# Patient Record
Sex: Male | Born: 1965 | Race: White | Hispanic: No | Marital: Single | Smoking: Current every day smoker
Health system: Southern US, Community
[De-identification: ages and names within clinical notes are randomized; demographics above are authoritative.]

## PROBLEM LIST (undated history)

## (undated) HISTORY — PX: HERNIA REPAIR: SHX51

---

## 1997-11-10 ENCOUNTER — Emergency Department (HOSPITAL_COMMUNITY): Admission: EM | Admit: 1997-11-10 | Discharge: 1997-11-10 | Payer: Self-pay | Admitting: Emergency Medicine

## 1999-11-27 ENCOUNTER — Emergency Department (HOSPITAL_COMMUNITY): Admission: EM | Admit: 1999-11-27 | Discharge: 1999-11-27 | Payer: Self-pay | Admitting: Emergency Medicine

## 1999-11-27 ENCOUNTER — Encounter: Payer: Self-pay | Admitting: Emergency Medicine

## 2000-02-13 ENCOUNTER — Emergency Department (HOSPITAL_COMMUNITY): Admission: EM | Admit: 2000-02-13 | Discharge: 2000-02-13 | Payer: Self-pay | Admitting: Emergency Medicine

## 2001-11-09 ENCOUNTER — Emergency Department (HOSPITAL_COMMUNITY): Admission: EM | Admit: 2001-11-09 | Discharge: 2001-11-09 | Payer: Self-pay | Admitting: Emergency Medicine

## 2008-04-16 ENCOUNTER — Emergency Department (HOSPITAL_COMMUNITY): Admission: EM | Admit: 2008-04-16 | Discharge: 2008-04-17 | Payer: Self-pay | Admitting: Emergency Medicine

## 2008-04-19 ENCOUNTER — Emergency Department (HOSPITAL_COMMUNITY): Admission: EM | Admit: 2008-04-19 | Discharge: 2008-04-19 | Payer: Self-pay | Admitting: Emergency Medicine

## 2009-08-09 ENCOUNTER — Emergency Department (HOSPITAL_BASED_OUTPATIENT_CLINIC_OR_DEPARTMENT_OTHER): Admission: EM | Admit: 2009-08-09 | Discharge: 2009-08-09 | Payer: Self-pay | Admitting: Emergency Medicine

## 2009-08-19 ENCOUNTER — Emergency Department (HOSPITAL_BASED_OUTPATIENT_CLINIC_OR_DEPARTMENT_OTHER): Admission: EM | Admit: 2009-08-19 | Discharge: 2009-08-19 | Payer: Self-pay | Admitting: Emergency Medicine

## 2009-09-03 ENCOUNTER — Emergency Department (HOSPITAL_BASED_OUTPATIENT_CLINIC_OR_DEPARTMENT_OTHER): Admission: EM | Admit: 2009-09-03 | Discharge: 2009-09-03 | Payer: Self-pay | Admitting: Emergency Medicine

## 2009-09-25 ENCOUNTER — Encounter (INDEPENDENT_AMBULATORY_CARE_PROVIDER_SITE_OTHER): Payer: Self-pay | Admitting: Family Medicine

## 2009-09-25 ENCOUNTER — Ambulatory Visit: Payer: Self-pay | Admitting: Internal Medicine

## 2009-09-25 LAB — CONVERTED CEMR LAB
ALT: 19 units/L (ref 0–53)
AST: 17 units/L (ref 0–37)
Albumin: 4.9 g/dL (ref 3.5–5.2)
Alkaline Phosphatase: 33 units/L — ABNORMAL LOW (ref 39–117)
BUN: 16 mg/dL (ref 6–23)
Basophils Absolute: 0 10*3/uL (ref 0.0–0.1)
Basophils Relative: 0 % (ref 0–1)
CO2: 28 meq/L (ref 19–32)
Calcium: 10.2 mg/dL (ref 8.4–10.5)
Chloride: 100 meq/L (ref 96–112)
Cholesterol: 200 mg/dL (ref 0–200)
Creatinine, Ser: 1.09 mg/dL (ref 0.40–1.50)
Eosinophils Absolute: 0.2 10*3/uL (ref 0.0–0.7)
Eosinophils Relative: 2 % (ref 0–5)
Glucose, Bld: 92 mg/dL (ref 70–99)
HCT: 49.2 % (ref 39.0–52.0)
HDL: 44 mg/dL (ref >39)
Hemoglobin: 16.2 g/dL (ref 13.0–17.0)
LDL Cholesterol: 127 mg/dL — ABNORMAL HIGH (ref 0–99)
Lymphocytes Relative: 25 % (ref 12–46)
Lymphs Abs: 2.8 10*3/uL (ref 0.7–4.0)
MCHC: 32.9 g/dL (ref 30.0–36.0)
MCV: 93.7 fL (ref 78.0–100.0)
Monocytes Absolute: 0.7 10*3/uL (ref 0.1–1.0)
Monocytes Relative: 7 % (ref 3–12)
Neutro Abs: 7.3 10*3/uL (ref 1.7–7.7)
Neutrophils Relative %: 66 % (ref 43–77)
Platelets: 306 10*3/uL (ref 150–400)
Potassium: 4.8 meq/L (ref 3.5–5.3)
RBC: 5.25 M/uL (ref 4.22–5.81)
RDW: 13.7 % (ref 11.5–15.5)
Sodium: 139 meq/L (ref 135–145)
Total Bilirubin: 0.6 mg/dL (ref 0.3–1.2)
Total CHOL/HDL Ratio: 4.5
Total Protein: 7 g/dL (ref 6.0–8.3)
Triglycerides: 147 mg/dL (ref <150)
VLDL: 29 mg/dL (ref 0–40)
WBC: 11 10*3/uL — ABNORMAL HIGH (ref 4.0–10.5)

## 2009-10-14 ENCOUNTER — Ambulatory Visit (HOSPITAL_COMMUNITY): Admission: RE | Admit: 2009-10-14 | Discharge: 2009-10-14 | Payer: Self-pay | Admitting: Family Medicine

## 2009-10-23 ENCOUNTER — Encounter: Admission: RE | Admit: 2009-10-23 | Discharge: 2009-11-20 | Payer: Self-pay | Admitting: Family Medicine

## 2009-11-06 ENCOUNTER — Encounter (INDEPENDENT_AMBULATORY_CARE_PROVIDER_SITE_OTHER): Payer: Self-pay | Admitting: Family Medicine

## 2009-11-06 LAB — CONVERTED CEMR LAB
Amphetamine Screen, Ur: NEGATIVE
Barbiturate Quant, Ur: NEGATIVE
Benzodiazepines.: NEGATIVE
Cocaine Metabolites: NEGATIVE
Creatinine,U: 75.4 mg/dL
Marijuana Metabolite: NEGATIVE
Methadone: NEGATIVE
Opiate Screen, Urine: NEGATIVE
Phencyclidine (PCP): NEGATIVE
Propoxyphene: NEGATIVE

## 2009-12-17 ENCOUNTER — Ambulatory Visit (HOSPITAL_BASED_OUTPATIENT_CLINIC_OR_DEPARTMENT_OTHER): Admission: RE | Admit: 2009-12-17 | Discharge: 2009-12-17 | Payer: Self-pay | Admitting: General Surgery

## 2010-04-15 LAB — CBC
HCT: 41.9 % (ref 39.0–52.0)
Hemoglobin: 14.7 g/dL (ref 13.0–17.0)
MCH: 30.7 pg (ref 26.0–34.0)
MCHC: 35.1 g/dL (ref 30.0–36.0)
MCV: 87.5 fL (ref 78.0–100.0)
Platelets: 264 10*3/uL (ref 150–400)
RBC: 4.79 MIL/uL (ref 4.22–5.81)
RDW: 12.4 % (ref 11.5–15.5)
WBC: 8.6 10*3/uL (ref 4.0–10.5)

## 2010-04-15 LAB — BASIC METABOLIC PANEL
BUN: 14 mg/dL (ref 6–23)
CO2: 26 mEq/L (ref 19–32)
Calcium: 9.2 mg/dL (ref 8.4–10.5)
Chloride: 104 mEq/L (ref 96–112)
Creatinine, Ser: 0.91 mg/dL (ref 0.4–1.5)
GFR calc Af Amer: >60 mL/min (ref >60)
GFR calc non Af Amer: >60 mL/min (ref >60)
Glucose, Bld: 94 mg/dL (ref 70–99)
Potassium: 4.7 mEq/L (ref 3.5–5.1)
Sodium: 137 mEq/L (ref 135–145)

## 2010-04-15 LAB — DIFFERENTIAL
Basophils Absolute: 0 10*3/uL (ref 0.0–0.1)
Eosinophils Absolute: 0.3 10*3/uL (ref 0.0–0.7)
Eosinophils Relative: 3 % (ref 0–5)
Lymphocytes Relative: 28 % (ref 12–46)
Lymphs Abs: 2.4 10*3/uL (ref 0.7–4.0)
Monocytes Absolute: 0.7 10*3/uL (ref 0.1–1.0)

## 2010-05-15 LAB — DIFFERENTIAL
Eosinophils Absolute: 0.3 10*3/uL (ref 0.0–0.7)
Eosinophils Relative: 4 % (ref 0–5)
Eosinophils Relative: 3 % (ref 0–5)
Lymphocytes Relative: 24 % (ref 12–46)
Lymphs Abs: 2.1 10*3/uL (ref 0.7–4.0)
Lymphs Abs: 3.2 10*3/uL (ref 0.7–4.0)
Monocytes Absolute: 0.6 10*3/uL (ref 0.1–1.0)
Monocytes Absolute: 1.1 10*3/uL — ABNORMAL HIGH (ref 0.1–1.0)
Monocytes Relative: 7 % (ref 3–12)
Monocytes Relative: 8 % (ref 3–12)

## 2010-05-15 LAB — CBC
MCHC: 34.5 g/dL (ref 30.0–36.0)
MCV: 90.9 fL (ref 78.0–100.0)
Platelets: 282 10*3/uL (ref 150–400)
RDW: 12.6 % (ref 11.5–15.5)
WBC: 13.2 10*3/uL — ABNORMAL HIGH (ref 4.0–10.5)

## 2010-05-15 LAB — COMPREHENSIVE METABOLIC PANEL
ALT: 15 U/L (ref 0–53)
AST: 20 U/L (ref 0–37)
AST: 15 U/L (ref 0–37)
Albumin: 3.7 g/dL (ref 3.5–5.2)
Albumin: 4 g/dL (ref 3.5–5.2)
Calcium: 9.3 mg/dL (ref 8.4–10.5)
Calcium: 9.2 mg/dL (ref 8.4–10.5)
Chloride: 104 mEq/L (ref 96–112)
Creatinine, Ser: 0.99 mg/dL (ref 0.4–1.5)
GFR calc Af Amer: >60 mL/min (ref >60)
GFR calc Af Amer: >60 mL/min (ref >60)
Sodium: 139 mEq/L (ref 135–145)
Total Bilirubin: 1 mg/dL (ref 0.3–1.2)
Total Protein: 6.3 g/dL (ref 6.0–8.3)

## 2010-05-15 LAB — ACETAMINOPHEN LEVEL: Acetaminophen (Tylenol), Serum: <10 ug/mL — ABNORMAL LOW (ref 10–30)

## 2010-05-15 LAB — RAPID URINE DRUG SCREEN, HOSP PERFORMED
Amphetamines: NOT DETECTED
Amphetamines: NOT DETECTED
Barbiturates: NOT DETECTED
Benzodiazepines: NOT DETECTED
Benzodiazepines: NOT DETECTED

## 2010-05-15 LAB — ETHANOL: Alcohol, Ethyl (B): <5 mg/dL (ref 0–10)

## 2010-05-15 LAB — TRICYCLICS SCREEN, URINE: TCA Scrn: NOT DETECTED

## 2010-06-09 ENCOUNTER — Inpatient Hospital Stay (INDEPENDENT_AMBULATORY_CARE_PROVIDER_SITE_OTHER)
Admission: RE | Admit: 2010-06-09 | Discharge: 2010-06-09 | Disposition: A | Discharge status: Home / Self Care | Payer: Self-pay | Source: Ambulatory Visit | Attending: Family Medicine | Admitting: Family Medicine

## 2010-06-09 DIAGNOSIS — T148XXA Other injury of unspecified body region, initial encounter: Secondary | ICD-10-CM

## 2012-06-22 ENCOUNTER — Emergency Department (INDEPENDENT_AMBULATORY_CARE_PROVIDER_SITE_OTHER)
Admission: EM | Admit: 2012-06-22 | Discharge: 2012-06-22 | Disposition: A | Discharge status: Home / Self Care | Payer: Self-pay | Source: Home / Self Care

## 2012-06-22 ENCOUNTER — Encounter (HOSPITAL_COMMUNITY): Payer: Self-pay | Admitting: Emergency Medicine

## 2012-06-22 DIAGNOSIS — S61219A Laceration without foreign body of unspecified finger without damage to nail, initial encounter: Secondary | ICD-10-CM

## 2012-06-22 DIAGNOSIS — S61209A Unspecified open wound of unspecified finger without damage to nail, initial encounter: Secondary | ICD-10-CM

## 2012-06-22 DIAGNOSIS — Z23 Encounter for immunization: Secondary | ICD-10-CM

## 2012-06-22 MED ORDER — CEPHALEXIN 500 MG PO CAPS: 500.0000 mg | ORAL_CAPSULE | Freq: Three times a day (TID) | ORAL | Status: AC

## 2012-06-22 MED ORDER — TETANUS-DIPHTH-ACELL PERTUSSIS 5-2.5-18.5 LF-MCG/0.5 IM SUSP
0.5000 mL | Freq: Once | INTRAMUSCULAR | Status: AC
  Administered 2012-06-22: 0.5 mL via INTRAMUSCULAR

## 2012-06-22 MED ORDER — CEPHALEXIN 500 MG PO CAPS: 500.0000 mg | ORAL_CAPSULE | Freq: Three times a day (TID) | ORAL | Status: DC

## 2012-06-22 MED ORDER — TETANUS-DIPHTH-ACELL PERTUSSIS 5-2.5-18.5 LF-MCG/0.5 IM SUSP
INTRAMUSCULAR | Status: AC
  Filled 2012-06-22: qty 0.5

## 2012-06-22 NOTE — ED Notes (Signed)
Patient describes wearing gloves, pruning trees.  Pruning saw slipped on small twig he was cutting.  Saw slid on twig and cut finger through glove.  Finger cut/puncture.  Patient reports cleansing wound with water, then alcohol, band aid and electrical tape.  Incident occurred last night.  Tissue under dressing is white, wet.  Difficult to determine if wound is laceration or puncture due to condition of tissue.

## 2012-06-22 NOTE — ED Provider Notes (Signed)
History     CSN: [id number]  Arrival date & time 06/22/12  1059   First MD Initiated Contact with Patient 06/22/12 1150      Chief Complaint  Patient presents with  . Laceration    (Consider location/radiation/quality/duration/timing/severity/associated sxs/prior treatment) HPI Comments: Patient describes wearing gloves, pruning trees.  Pruning saw slipped on small twig he was cutting.  Saw slid on twig and cut finger through glove.  Finger cut/puncture.  Patient reports cleansing wound with water, then alcohol, band aid and electrical tape.  Incident occurred last night.  Injury occurred to his left index finger. Patient able to fully move his finger denies any numbness or tingling sensation distally or proximally to the wound. He does not recall when he got his last tetanus shot but he thinks is more than 6 years ago  Patient is a 47 y.o. male presenting with skin laceration. The history is provided by the patient.  Laceration Location:  Hand   History reviewed. No pertinent past medical history.  History reviewed. No pertinent past surgical history.  No family history on file.  History  Substance Use Topics  . Smoking status: Current Every Day Smoker  . Smokeless tobacco: Not on file  . Alcohol Use: No      Review of Systems  Constitutional: Negative for activity change and appetite change.  Musculoskeletal: Negative for back pain and joint swelling.  Skin: Positive for wound.  Neurological: Negative for weakness and numbness.    Allergies  Review of patient's allergies indicates no known allergies.  Home Medications  No current outpatient prescriptions on file.  BP 135/83  Pulse 73  Temp(Src) 97.6 F (36.4 C) (Oral)  Resp 16  SpO2 96%  Physical Exam  Nursing note and vitals reviewed. Constitutional: He appears well-developed and well-nourished.  Non-toxic appearance. He does not have a sickly appearance. He does not appear ill.  Musculoskeletal: He  exhibits tenderness.       Left hand: He exhibits laceration. He exhibits normal range of motion, no tenderness, no bony tenderness, normal two-point discrimination and no deformity. Normal sensation noted. Normal strength noted.       Hands: Neurological: He is alert.  Skin: No rash noted. No erythema.    ED Course  Procedures (including critical care time)  Labs Reviewed - No data to display No results found.   1. Laceration of finger, initial encounter       MDM  Uncomplicated laceration to left index finger- delayed presentation-laceration not candidate for sutures. Steri-Strip and tetanus status updated today. Instructed patient to take ibuprofen for pain and discomfort. Finger Splint provided for comfort for the next 48-72 hours     Jimmie Molly, MD 06/22/12 1258

## 2012-06-22 NOTE — ED Notes (Signed)
Soaking finger in saline and scant betadine.

## 2012-06-22 NOTE — ED Notes (Signed)
Repositioned exam table

## 2012-12-08 ENCOUNTER — Other Ambulatory Visit: Payer: Self-pay

## 2013-11-17 ENCOUNTER — Other Ambulatory Visit: Payer: Self-pay

## 2016-04-10 ENCOUNTER — Encounter (HOSPITAL_COMMUNITY): Payer: Self-pay | Admitting: *Deleted

## 2016-04-10 ENCOUNTER — Ambulatory Visit (HOSPITAL_COMMUNITY)
Admission: EM | Admit: 2016-04-10 | Discharge: 2016-04-10 | Disposition: A | Discharge status: Home / Self Care | Payer: Self-pay | Attending: Emergency Medicine | Admitting: Emergency Medicine

## 2016-04-10 DIAGNOSIS — R220 Localized swelling, mass and lump, head: Secondary | ICD-10-CM

## 2016-04-10 DIAGNOSIS — K047 Periapical abscess without sinus: Secondary | ICD-10-CM

## 2016-04-10 MED ORDER — AMOXICILLIN 500 MG PO CAPS: 1000.0000 mg | ORAL_CAPSULE | Freq: Two times a day (BID) | ORAL | 0 refills | Status: DC

## 2016-04-10 MED ORDER — HYDROCODONE-ACETAMINOPHEN 5-325 MG PO TABS: 1.0000 | ORAL_TABLET | ORAL | 0 refills | Status: DC | PRN

## 2016-04-10 NOTE — ED Provider Notes (Signed)
CSN: [id number]     Arrival date & time 04/10/16  1025 History   First MD Initiated Contact with Patient 04/10/16 1215     Chief Complaint  Patient presents with  . Facial Swelling   (Consider location/radiation/quality/duration/timing/severity/associated sxs/prior Treatment) 51 year old male presents with pain in the right lower jaw and right molars. He noticed swelling to the face yesterday that is worsened today. He states he knows he has "bad teeth". He has a history of  infection in the teeth such to a point that he had to be admitted for abscess I&D with drains and IV antibiotics many years ago.      History reviewed. No pertinent past medical history. Past Surgical History:  Procedure Laterality Date  . HERNIA REPAIR     History reviewed. No pertinent family history. Social History  Substance Use Topics  . Smoking status: Current Every Day Smoker  . Smokeless tobacco: Never Used  . Alcohol use No    Review of Systems  Constitutional: Negative.   HENT: Positive for dental problem. Negative for congestion and rhinorrhea.   Respiratory: Negative.   Cardiovascular: Negative.   Neurological: Negative.   All other systems reviewed and are negative.   Allergies  Patient has no known allergies.  Home Medications   Prior to Admission medications   Medication Sig Start Date End Date Taking? Authorizing Provider  IBUPROFEN IB PO Take by mouth.   Yes Historical Provider, MD  amoxicillin (AMOXIL) 500 MG capsule Take 2 capsules (1,000 mg total) by mouth 2 (two) times daily. 04/10/16   Hayden Rasmussenavid Olis Viverette, NP  HYDROcodone-acetaminophen (NORCO/VICODIN) 5-325 MG tablet Take 1 tablet by mouth every 4 (four) hours as needed. 04/10/16   Hayden Rasmussenavid Emme Rosenau, NP   Meds Ordered and Administered this Visit  Medications - No data to display  BP 146/70 (BP Location: Right Arm)   Pulse 78   Temp 98.6 F (37 C) (Oral)   Resp 18   SpO2 100%  No data found.   Physical Exam  Constitutional: He is  oriented to person, place, and time. He appears well-developed and well-nourished. No distress.  HENT:  Head: Normocephalic and atraumatic.  Swelling to the right face opposite the mandible. Oral exam reveals poor dentition with eroded molars. The gingiva surrounding these molars are erythematous and swollen. Minor swelling to the right buccal mucosa. Positive for dental and gingival tenderness. Airway widely patent.  Neck: Normal range of motion. Neck supple.  Cardiovascular: Normal rate.   Pulmonary/Chest: Effort normal.  Lymphadenopathy:    He has no cervical adenopathy.  Neurological: He is alert and oriented to person, place, and time.  Skin: Skin is warm and dry.  Nursing note and vitals reviewed.   Urgent Care Course     Procedures (including critical care time)  Labs Review Labs Reviewed - No data to display  Imaging Review No results found.   Visual Acuity Review  Right Eye Distance:   Left Eye Distance:   Bilateral Distance:    Right Eye Near:   Left Eye Near:    Bilateral Near:         MDM   1. Dental abscess   2. Facial swelling   3. Dental infection    Try to find a dentist as soon as possible on Monday. In the meantime take the medication as directed. Make sure you take all of the antibiotic. Meds ordered this encounter  Medications  . IBUPROFEN IB PO    Sig: Take by  mouth.  . amoxicillin (AMOXIL) 500 MG capsule    Sig: Take 2 capsules (1,000 mg total) by mouth 2 (two) times daily.    Dispense:  40 capsule    Refill:  0    Order Specific Question:   Supervising Provider    Answer:   Domenick Gong [[id number]]  . HYDROcodone-acetaminophen (NORCO/VICODIN) 5-325 MG tablet    Sig: Take 1 tablet by mouth every 4 (four) hours as needed.    Dispense:  15 tablet    Refill:  0    Order Specific Question:   Supervising Provider    Answer:   Domenick Gong [[id number]]       Hayden Rasmussen, NP 04/10/16 1236

## 2016-04-10 NOTE — ED Triage Notes (Signed)
Woke  Up with  Facial  Swelling         Yesterday           Dental  [place]  In past

## 2016-04-10 NOTE — Discharge Instructions (Signed)
Try to find a dentist as soon as possible on Monday. In the meantime take the medication as directed. Make sure you take all of the antibiotic.

## 2018-03-14 ENCOUNTER — Ambulatory Visit (HOSPITAL_COMMUNITY): Payer: Self-pay | Admitting: Certified Registered"

## 2018-03-14 ENCOUNTER — Ambulatory Visit (HOSPITAL_COMMUNITY)
Admission: EM | Admit: 2018-03-14 | Discharge: 2018-03-14 | Disposition: A | Discharge status: Home / Self Care | Payer: Self-pay | Attending: Urgent Care | Admitting: Urgent Care

## 2018-03-14 ENCOUNTER — Ambulatory Visit (HOSPITAL_COMMUNITY)
Admission: RE | Admit: 2018-03-14 | Discharge: 2018-03-14 | Disposition: A | Discharge status: Home / Self Care | Payer: Self-pay | Source: Other Acute Inpatient Hospital | Attending: General Surgery | Admitting: General Surgery

## 2018-03-14 ENCOUNTER — Encounter (HOSPITAL_COMMUNITY): Payer: Self-pay | Admitting: Emergency Medicine

## 2018-03-14 ENCOUNTER — Ambulatory Visit (INDEPENDENT_AMBULATORY_CARE_PROVIDER_SITE_OTHER): Payer: Self-pay

## 2018-03-14 ENCOUNTER — Encounter (HOSPITAL_COMMUNITY): Admission: RE | Disposition: A | Discharge status: Home / Self Care | Payer: Self-pay | Attending: General Surgery

## 2018-03-14 DIAGNOSIS — M79645 Pain in left finger(s): Secondary | ICD-10-CM

## 2018-03-14 DIAGNOSIS — S61012A Laceration without foreign body of left thumb without damage to nail, initial encounter: Secondary | ICD-10-CM

## 2018-03-14 DIAGNOSIS — W270XXA Contact with workbench tool, initial encounter: Secondary | ICD-10-CM | POA: Insufficient documentation

## 2018-03-14 DIAGNOSIS — F17200 Nicotine dependence, unspecified, uncomplicated: Secondary | ICD-10-CM | POA: Insufficient documentation

## 2018-03-14 DIAGNOSIS — L089 Local infection of the skin and subcutaneous tissue, unspecified: Secondary | ICD-10-CM | POA: Insufficient documentation

## 2018-03-14 HISTORY — PX: I & D EXTREMITY: SHX5045

## 2018-03-14 LAB — CBC
HCT: 50.8 % (ref 39.0–52.0)
Hemoglobin: 16.8 g/dL (ref 13.0–17.0)
MCH: 30.3 pg (ref 26.0–34.0)
MCHC: 33.1 g/dL (ref 30.0–36.0)
MCV: 91.7 fL (ref 80.0–100.0)
NRBC: 0 % (ref 0.0–0.2)
Platelets: 312 10*3/uL (ref 150–400)
RBC: 5.54 MIL/uL (ref 4.22–5.81)
RDW: 12.4 % (ref 11.5–15.5)
WBC: 14.8 10*3/uL — ABNORMAL HIGH (ref 4.0–10.5)

## 2018-03-14 SURGERY — IRRIGATION AND DEBRIDEMENT EXTREMITY
Anesthesia: General | Site: Thumb | Laterality: Left

## 2018-03-14 MED ORDER — DEXAMETHASONE SODIUM PHOSPHATE 10 MG/ML IJ SOLN
INTRAMUSCULAR | Status: AC
  Filled 2018-03-14: qty 1

## 2018-03-14 MED ORDER — SODIUM CHLORIDE 0.9 % IR SOLN
Status: DC | PRN
  Administered 2018-03-14: 3000 mL

## 2018-03-14 MED ORDER — CEFAZOLIN SODIUM 1 G IJ SOLR
INTRAMUSCULAR | Status: AC
  Filled 2018-03-14: qty 20

## 2018-03-14 MED ORDER — LIDOCAINE 2% (20 MG/ML) 5 ML SYRINGE
INTRAMUSCULAR | Status: DC | PRN
  Administered 2018-03-14: 80 mg via INTRAVENOUS

## 2018-03-14 MED ORDER — BUPIVACAINE HCL (PF) 0.25 % IJ SOLN
INTRAMUSCULAR | Status: AC
  Filled 2018-03-14: qty 10

## 2018-03-14 MED ORDER — CEPHALEXIN 500 MG PO CAPS: 500.0000 mg | ORAL_CAPSULE | Freq: Four times a day (QID) | ORAL | 0 refills | Status: AC

## 2018-03-14 MED ORDER — LACTATED RINGERS IV SOLN
INTRAVENOUS | Status: DC
  Administered 2018-03-14: 15:00:00 via INTRAVENOUS

## 2018-03-14 MED ORDER — CHLORHEXIDINE GLUCONATE 4 % EX LIQD: 60.0000 mL | Freq: Once | CUTANEOUS | Status: DC

## 2018-03-14 MED ORDER — DEXAMETHASONE SODIUM PHOSPHATE 10 MG/ML IJ SOLN
INTRAMUSCULAR | Status: DC | PRN
  Administered 2018-03-14: 10 mg via INTRAVENOUS

## 2018-03-14 MED ORDER — CEFAZOLIN SODIUM-DEXTROSE 2-3 GM-%(50ML) IV SOLR
INTRAVENOUS | Status: DC | PRN
  Administered 2018-03-14: 2 g via INTRAVENOUS

## 2018-03-14 MED ORDER — FENTANYL CITRATE (PF) 250 MCG/5ML IJ SOLN
INTRAMUSCULAR | Status: AC
  Filled 2018-03-14: qty 5

## 2018-03-14 MED ORDER — ACETAMINOPHEN 10 MG/ML IV SOLN: 1000.0000 mg | Freq: Once | INTRAVENOUS | Status: DC | PRN

## 2018-03-14 MED ORDER — POVIDONE-IODINE 10 % EX SWAB: 2.0000 "application " | Freq: Once | CUTANEOUS | Status: DC

## 2018-03-14 MED ORDER — OXYCODONE HCL 5 MG PO TABS: 5.0000 mg | ORAL_TABLET | Freq: Once | ORAL | Status: DC | PRN

## 2018-03-14 MED ORDER — ONDANSETRON HCL 4 MG/2ML IJ SOLN
INTRAMUSCULAR | Status: DC | PRN
  Administered 2018-03-14: 4 mg via INTRAVENOUS

## 2018-03-14 MED ORDER — PROMETHAZINE HCL 25 MG/ML IJ SOLN: 6.2500 mg | INTRAMUSCULAR | Status: DC | PRN

## 2018-03-14 MED ORDER — FENTANYL CITRATE (PF) 250 MCG/5ML IJ SOLN
INTRAMUSCULAR | Status: DC | PRN
  Administered 2018-03-14 (×6): 25 ug via INTRAVENOUS

## 2018-03-14 MED ORDER — ONDANSETRON HCL 4 MG/2ML IJ SOLN
INTRAMUSCULAR | Status: AC
  Filled 2018-03-14: qty 2

## 2018-03-14 MED ORDER — MIDAZOLAM HCL 2 MG/2ML IJ SOLN
INTRAMUSCULAR | Status: DC | PRN
  Administered 2018-03-14: 2 mg via INTRAVENOUS

## 2018-03-14 MED ORDER — TETANUS-DIPHTH-ACELL PERTUSSIS 5-2.5-18.5 LF-MCG/0.5 IM SUSP
0.5000 mL | Freq: Once | INTRAMUSCULAR | Status: AC
  Administered 2018-03-14: 0.5 mL via INTRAMUSCULAR

## 2018-03-14 MED ORDER — TETANUS-DIPHTH-ACELL PERTUSSIS 5-2.5-18.5 LF-MCG/0.5 IM SUSP
INTRAMUSCULAR | Status: AC
  Filled 2018-03-14: qty 0.5

## 2018-03-14 MED ORDER — PROPOFOL 10 MG/ML IV BOLUS
INTRAVENOUS | Status: DC | PRN
  Administered 2018-03-14: 140 mg via INTRAVENOUS

## 2018-03-14 MED ORDER — FENTANYL CITRATE (PF) 100 MCG/2ML IJ SOLN: 25.0000 ug | INTRAMUSCULAR | Status: DC | PRN

## 2018-03-14 MED ORDER — MIDAZOLAM HCL 2 MG/2ML IJ SOLN
INTRAMUSCULAR | Status: AC
  Filled 2018-03-14: qty 2

## 2018-03-14 MED ORDER — BUPIVACAINE HCL (PF) 0.25 % IJ SOLN
INTRAMUSCULAR | Status: DC | PRN
  Administered 2018-03-14: 10 mL

## 2018-03-14 MED ORDER — OXYCODONE HCL 5 MG/5ML PO SOLN: 5.0000 mg | Freq: Once | ORAL | Status: DC | PRN

## 2018-03-14 MED ORDER — HYDROCODONE-ACETAMINOPHEN 5-325 MG PO TABS: 1.0000 | ORAL_TABLET | Freq: Four times a day (QID) | ORAL | 0 refills | Status: AC | PRN

## 2018-03-14 SURGICAL SUPPLY — 43 items
BAG DECANTER FOR FLEXI CONT (MISCELLANEOUS) ×3 IMPLANT
BANDAGE ACE 3X5.8 VEL STRL LF (GAUZE/BANDAGES/DRESSINGS) IMPLANT
BANDAGE ACE 4X5 VEL STRL LF (GAUZE/BANDAGES/DRESSINGS) IMPLANT
BNDG COHESIVE 1X5 TAN STRL LF (GAUZE/BANDAGES/DRESSINGS) ×2 IMPLANT
BNDG CONFORM 2 STRL LF (GAUZE/BANDAGES/DRESSINGS) ×2 IMPLANT
BNDG GAUZE ELAST 4 BULKY (GAUZE/BANDAGES/DRESSINGS) IMPLANT
CORDS BIPOLAR (ELECTRODE) IMPLANT
COVER WAND RF STERILE (DRAPES) ×3 IMPLANT
CUFF TOURNIQUET SINGLE 18IN (TOURNIQUET CUFF) IMPLANT
DRAPE SURG 17X23 STRL (DRAPES) ×3 IMPLANT
ELECT REM PT RETURN 9FT ADLT (ELECTROSURGICAL)
ELECTRODE REM PT RTRN 9FT ADLT (ELECTROSURGICAL) IMPLANT
FLUID NSS /IRRIG 3000 ML XXX (IV SOLUTION) ×2 IMPLANT
GAUZE PACKING IODOFORM 1/4X5 (PACKING) IMPLANT
GAUZE SPONGE 4X4 12PLY STRL (GAUZE/BANDAGES/DRESSINGS) ×3 IMPLANT
GAUZE XEROFORM 1X8 LF (GAUZE/BANDAGES/DRESSINGS) ×3 IMPLANT
GLOVE BIOGEL M 8.0 STRL (GLOVE) ×3 IMPLANT
GOWN STRL REUS W/ TWL LRG LVL3 (GOWN DISPOSABLE) ×2 IMPLANT
GOWN STRL REUS W/TWL LRG LVL3 (GOWN DISPOSABLE) ×6
HANDPIECE INTERPULSE COAX TIP (DISPOSABLE)
KIT BASIN OR (CUSTOM PROCEDURE TRAY) ×3 IMPLANT
KIT TURNOVER KIT B (KITS) ×3 IMPLANT
MANIFOLD NEPTUNE II (INSTRUMENTS) ×3 IMPLANT
NDL HYPO 25GX1X1/2 BEV (NEEDLE) IMPLANT
NEEDLE HYPO 25GX1X1/2 BEV (NEEDLE) IMPLANT
NS IRRIG 1000ML POUR BTL (IV SOLUTION) ×3 IMPLANT
PACK ORTHO EXTREMITY (CUSTOM PROCEDURE TRAY) ×3 IMPLANT
PAD ARMBOARD 7.5X6 YLW CONV (MISCELLANEOUS) ×6 IMPLANT
PAD CAST 4YDX4 CTTN HI CHSV (CAST SUPPLIES) IMPLANT
PADDING CAST COTTON 4X4 STRL (CAST SUPPLIES)
SET CYSTO W/LG BORE CLAMP LF (SET/KITS/TRAYS/PACK) ×2 IMPLANT
SET HNDPC FAN SPRY TIP SCT (DISPOSABLE) IMPLANT
SOAP 2 % CHG 4 OZ (WOUND CARE) ×3 IMPLANT
SPONGE LAP 18X18 X RAY DECT (DISPOSABLE) ×2 IMPLANT
SPONGE LAP 4X18 RFD (DISPOSABLE) ×3 IMPLANT
SWAB CULTURE ESWAB REG 1ML (MISCELLANEOUS) IMPLANT
SYR CONTROL 10ML LL (SYRINGE) IMPLANT
TOWEL OR 17X24 6PK STRL BLUE (TOWEL DISPOSABLE) ×3 IMPLANT
TOWEL OR 17X26 10 PK STRL BLUE (TOWEL DISPOSABLE) ×3 IMPLANT
TUBE CONNECTING 12'X1/4 (SUCTIONS) ×1
TUBE CONNECTING 12X1/4 (SUCTIONS) ×2 IMPLANT
WATER STERILE IRR 1000ML POUR (IV SOLUTION) ×1 IMPLANT
YANKAUER SUCT BULB TIP NO VENT (SUCTIONS) ×3 IMPLANT

## 2018-03-14 NOTE — Transfer of Care (Signed)
Immediate Anesthesia Transfer of Care Note  Patient: Jose Novak  Procedure(s) Performed: IRRIGATION AND DEBRIDEMENT EXTREMITY (Left Thumb)  Patient Location: PACU  Anesthesia Type:General  Level of Consciousness: awake and patient cooperative  Airway & Oxygen Therapy: Patient Spontanous Breathing  Post-op Assessment: Report given to RN and Post -op Vital signs reviewed and stable  Post vital signs: Reviewed and stable  Last Vitals:  Vitals Value Taken Time  BP 131/86 03/14/2018  5:54 PM  Temp    Pulse 71 03/14/2018  5:57 PM  Resp 20 03/14/2018  5:57 PM  SpO2 99 % 03/14/2018  5:57 PM  Vitals shown include unvalidated device data.  Last Pain:  Vitals:   03/14/18 1416  TempSrc: Oral      Patients Stated Pain Goal: 2 (03/14/18 1409)  Complications: No apparent anesthesia complications

## 2018-03-14 NOTE — Anesthesia Procedure Notes (Signed)
Procedure Name: LMA Insertion Date/Time: 03/14/2018 5:19 PM Performed by: Rosiland Oz, CRNA Pre-anesthesia Checklist: Patient identified, Emergency Drugs available, Suction available, Patient being monitored and Timeout performed Patient Re-evaluated:Patient Re-evaluated prior to induction Oxygen Delivery Method: Circle system utilized Preoxygenation: Pre-oxygenation with 100% oxygen Induction Type: IV induction LMA: LMA inserted LMA Size: 4.0 Number of attempts: 2 Placement Confirmation: positive ETCO2 and breath sounds checked- equal and bilateral Tube secured with: Tape Dental Injury: Teeth and Oropharynx as per pre-operative assessment

## 2018-03-14 NOTE — Consult Note (Signed)
Reason for Consult:Left thumb infection Referring Physician: Diamond NickelM Manny  Kaenan M Novak is an 53 y.o. male.  HPI: Jose HuaDavid cut his thumb with a hand saw 1 weeks ago. He has been treating and cleaning it at home but over the weekend had increasing pain and swelling and began to have pus drain out of it. [facility] were concerned regarding tenosynovitis and consulted hand surgery. Based on the description from the PA [facility]. 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 He denies fevers, chills, sweats, N/V.  No past medical history on file.  Past Surgical History:  Procedure Laterality Date  . HERNIA REPAIR      No family history on file.  Social History:  reports that he has been smoking. He has never used smokeless tobacco. He reports that he does not drink alcohol or use drugs.  Allergies: No Known Allergies  Medications: I have reviewed the patient's current medications.  No results found for this or any previous visit (from the past 48 hour(s)).  Dg Finger Thumb Left  Result Date: 03/14/2018 CLINICAL DATA:  Injury with Sol 6 days ago. Pain in the interphalangeal joint. EXAM: LEFT THUMB 2+V COMPARISON:  None. FINDINGS: Soft tissue swelling. There is a sliver of high attenuation in the swollen soft tissues along the dorsal aspect of the thumb. No donor site. No fracture noted. No soft tissue gas or bony erosion. Two tiny foci of high attenuation more superficially in the swollen soft tissues. IMPRESSION: 1. Three foci of high attenuation in the swollen soft tissues overlying the interphalangeal joint. The findings are suspicious for small foreign bodies given history. No donor sites to suggest fracture fragments. 2. No fractures or bony erosion. 3. Degenerative changes in the interphalangeal joint. Electronically Signed   By: Gerome Samavid  Williams III M.D   On: 03/14/2018 13:04    Review of Systems  Constitutional: Negative for chills, fever and weight loss.  HENT:  Negative for ear discharge, ear pain, hearing loss and tinnitus.   Eyes: Negative for blurred vision, double vision, photophobia and pain.  Respiratory: Negative for cough, sputum production and shortness of breath.   Cardiovascular: Negative for chest pain.  Gastrointestinal: Negative for abdominal pain, nausea and vomiting.  Genitourinary: Negative for dysuria, flank pain, frequency and urgency.  Musculoskeletal: Positive for joint pain (Left thumb). Negative for back pain, falls, myalgias and neck pain.  Neurological: Negative for dizziness, tingling, sensory change, focal weakness, loss of consciousness and headaches.  Endo/Heme/Allergies: Does not bruise/bleed easily.  Psychiatric/Behavioral: Negative for depression, memory loss and substance abuse. The patient is not nervous/anxious.    Blood pressure 137/84, pulse 75, temperature 98 F (36.7 C), temperature source Oral, resp. rate 20, height 5' 9.5" (1.765 m), weight 68 kg, SpO2 100 %. Physical Exam  Constitutional: He appears well-developed and well-nourished. No distress.  HENT:  Head: Normocephalic and atraumatic.  Eyes: Conjunctivae are normal. Right eye exhibits no discharge. Left eye exhibits no discharge. No scleral icterus.  Neck: Normal range of motion.  Cardiovascular: Normal rate and regular rhythm.  Respiratory: Effort normal. No respiratory distress.  Musculoskeletal:     Comments: Left shoulder, elbow, wrist, digits- Longitudinal laceration over thumb IP joint covered with liquid bandage, purulence extruding from wound, erythematous with fusiform edema, mod TTP, no instability, no blocks to motion, able to flex/extend joint with pain  Sens  Ax/R/M/U intact  Mot   Ax/ R/ PIN/ M/ AIN/ U  intact  Rad 2+  Neurological: He is alert.  Skin: Skin is warm and dry. He is not diaphoretic.  Psychiatric: He has a normal mood and affect. His behavior is normal.    Assessment/Plan: Left thumb infection -- Extensor tendon intact,  unlikely to have tenosynovitis given range of motion. Anticipate simple I&D by Dr. Izora Ribasoley and discharge home.    Freeman CaldronMichael J. Dalicia Kisner, PA-C Orthopedic Surgery [phone] 03/14/2018, 2:27 PM

## 2018-03-14 NOTE — Discharge Instructions (Addendum)
May unwrap thumb in 48 hours, wash with soap and water, apply small amount of antibiotic ointment and recover.  Keep wound covered.  Wash 2-3 times daily.

## 2018-03-14 NOTE — Discharge Instructions (Signed)
Please [facility] so that you can be seen by a hand surgeon.

## 2018-03-14 NOTE — ED Triage Notes (Signed)
Pt states he cut his L thumb last week, states  He tried to put it back together with a liquid bandage, states he thinks its infected. L thumb is swollen.

## 2018-03-14 NOTE — Progress Notes (Signed)
S:pt seen and examined.  Questions answered.  Agree with history and exam by PA Charma Igo.  Medical history reviewed.  O:Blood pressure 137/84, pulse 75, temperature 98 F (36.7 C), temperature source Oral, resp. rate 20, height 5' 9.5" (1.765 m), weight 68 kg, SpO2 100 %.  L thumb with dorsal laceration with some purulent drainage  A: L thumb infection, ? Tendon/joint involvement   P:will I&D. Discussed plan with patient, consent obtained.

## 2018-03-14 NOTE — Anesthesia Preprocedure Evaluation (Addendum)
Anesthesia Evaluation  Patient identified by MRN, date of birth, ID band Patient awake    Reviewed: Allergy & Precautions, NPO status , Patient's Chart, lab work & pertinent test results  History of Anesthesia Complications Negative for: history of anesthetic complications  Airway Mallampati: II  TM Distance: >3 FB Neck ROM: Full    Dental  (+) Dental Advisory Given, Poor Dentition, Missing,    Pulmonary Current Smoker,    Pulmonary exam normal breath sounds clear to auscultation       Cardiovascular negative cardio ROS Normal cardiovascular exam Rhythm:Regular Rate:Normal     Neuro/Psych negative neurological ROS     GI/Hepatic negative GI ROS, Neg liver ROS,   Endo/Other  negative endocrine ROS  Renal/GU negative Renal ROS     Musculoskeletal Left thumb infection   Abdominal   Peds  Hematology negative hematology ROS (+)   Anesthesia Other Findings Day of surgery medications reviewed with the patient.  Reproductive/Obstetrics                            Anesthesia Physical Anesthesia Plan  ASA: II  Anesthesia Plan: General   Post-op Pain Management:    Induction: Intravenous  PONV Risk Score and Plan: 2 and Treatment may vary due to age or medical condition, Ondansetron, Dexamethasone and Midazolam  Airway Management Planned: LMA  Additional Equipment:   Intra-op Plan:   Post-operative Plan: Extubation in OR  Informed Consent: I have reviewed the patients History and Physical, chart, labs and discussed the procedure including the risks, benefits and alternatives for the proposed anesthesia with the patient or authorized representative who has indicated his/her understanding and acceptance.     Dental advisory given  Plan Discussed with: CRNA  Anesthesia Plan Comments:        Anesthesia Quick Evaluation

## 2018-03-14 NOTE — Anesthesia Postprocedure Evaluation (Signed)
Anesthesia Post Note  Patient: Jose Novak  Procedure(s) Performed: IRRIGATION AND DEBRIDEMENT EXTREMITY (Left Thumb)     Patient location during evaluation: PACU Anesthesia Type: General Level of consciousness: awake and alert Pain management: pain level controlled Vital Signs Assessment: post-procedure vital signs reviewed and stable Respiratory status: spontaneous breathing, nonlabored ventilation, respiratory function stable and patient connected to nasal cannula oxygen Cardiovascular status: blood pressure returned to baseline and stable Postop Assessment: no apparent nausea or vomiting Anesthetic complications: no    Last Vitals:  Vitals:   03/14/18 1830 03/14/18 1840  BP: 133/83 127/78  Pulse: 75 75  Resp: 19 18  Temp: 36.7 C 36.7 C  SpO2: 96% 97%    Last Pain:  Vitals:   03/14/18 1831  TempSrc:   PainSc: 0-No pain                 Necole Minassian,Nabor COKER

## 2018-03-14 NOTE — ED Provider Notes (Signed)
  MRN: [id number] DOB: [birth year]  Subjective:   Jose Novak is a 53 y.o. male presenting for 1 week history of worsening, constant sharp left thumb pain status post left thumb laceration while using a saw.  Patient reports that he tried to do wound care on his own and his left thumb laceration has progressively worsened.  Reports that it straining, has worsening swelling and pain and some redness.  He is very anxious about how bad his infection is.  Denies fever, nausea, vomiting, belly pain, red streaks along his hand or arm.  He believes he has range of motion but refuses to move his left thumb from fear of opening up his wound again.  Reports that his last Td was in ~2014.  No Known Allergies  History reviewed. No pertinent past medical history.   Past Surgical History:  Procedure Laterality Date  . HERNIA REPAIR     ROS  Objective:   Vitals: BP 132/77   Pulse 77   Temp 99 F (37.2 C)   Resp 18   SpO2 99%   Physical Exam Constitutional:      Appearance: Normal appearance. He is well-developed and normal weight.  HENT:     Head: Normocephalic and atraumatic.     Right Ear: External ear normal.     Left Ear: External ear normal.     Nose: Nose normal.     Mouth/Throat:     Pharynx: Oropharynx is clear.  Eyes:     Extraocular Movements: Extraocular movements intact.     Pupils: Pupils are equal, round, and reactive to light.  Cardiovascular:     Rate and Rhythm: Normal rate.  Pulmonary:     Effort: Pulmonary effort is normal.  Musculoskeletal:     Left hand: He exhibits decreased range of motion, tenderness and swelling. He exhibits normal capillary refill and no deformity. Normal sensation noted. Normal strength noted.       Hands:  Neurological:     Mental Status: He is alert and oriented to person, place, and time.  Psychiatric:        Mood and Affect: Mood normal.        Behavior: Behavior normal.    Dg Finger Thumb Left  Result Date: 03/14/2018 CLINICAL  DATA:  Injury with Sol 6 days ago. Pain in the interphalangeal joint. EXAM: LEFT THUMB 2+V COMPARISON:  None. FINDINGS: Soft tissue swelling. There is a sliver of high attenuation in the swollen soft tissues along the dorsal aspect of the thumb. No donor site. No fracture noted. No soft tissue gas or bony erosion. Two tiny foci of high attenuation more superficially in the swollen soft tissues. IMPRESSION: 1. Three foci of high attenuation in the swollen soft tissues overlying the interphalangeal joint. The findings are suspicious for small foreign bodies given history. No donor sites to suggest fracture fragments. 2. No fractures or bony erosion. 3. Degenerative changes in the interphalangeal joint. Electronically Signed   By: Gerome Sam III M.D   On: 03/14/2018 13:04   Assessment and Plan :   Laceration of left thumb without foreign body without damage to nail, initial encounter  Discussed case with PA Leotis Shames for patient's concerning hand laceration and possibility of need for surgical debridement.  He was redirected to the ER as PA [facility] consulted with hand surgeon and will need to be seen emergently.    Wallis Bamberg, [place] 03/14/18 1338

## 2018-03-15 ENCOUNTER — Encounter (HOSPITAL_COMMUNITY): Payer: Self-pay | Admitting: General Surgery

## 2018-03-15 NOTE — Op Note (Signed)
NAME: Jose Novak, Jose Novak MEDICAL RECORD [id number] ACCOUNT [id number] DATE OF BIRTH:Apr 07, 1965 FACILITY: [facility] LOCATION: [facility] PHYSICIAN:Morene Cecilio Harle Battiest, MD  OPERATIVE REPORT  DATE OF PROCEDURE:  03/14/2018  PREOPERATIVE DIAGNOSIS:  Infection of left thumb.  POSTOPERATIVE DIAGNOSIS:  Infection of left thumb.  PROCEDURE:  Incision and drainage of left thumb including the dorsal interphalangeal joint and extensor tendon full thickness sharp debridement of skin and subcutaneous tissue.  INDICATIONS:  The patient is a 53 year old male lacerated his thumb with a small several days ago.  It has since become more swollen, infected and draining pus.  On evaluation, he had diffuse swelling of the thumb suggestive of joint involvement.  Risks,  benefits and alternatives of surgery, I and D were discussed with the patient.  He agreed with this course of action.  Consent was obtained.  DESCRIPTION OF PROCEDURE:  The patient was taken to the operating room and placed supine on the operating table.  Anesthesia was administered without difficulty.  Timeout was performed.  Preoperative antibiotics were given.  The left upper extremity was  prepped and draped in normal sterile fashion.  The old wound was opened.  It was an approximately 2 cm curved laceration over the IP joint.  The wound apparently had been held together by what looked like medical superglue.  This was removed.  The skin  edges were sharply debrided with a knife and scissors to create a nice clean edge.  The wound was then explored.  The radial side of the extensor tendon was frayed a bit.  Probing this wound.  There was involvement of the IP joint.  There was no gross  purulence that was seen.  Following, irrigation of the joint as well as the tendon sheath were thoroughly performed with approximately 2 liters of saline solution.  Afterwards, hemostasis was controlled with direct pressure.  The skin was very loosely  approximated over  the tendon and a sterile dressing was placed.   The patient tolerated the procedure well and was taken to recovery room stable.  AN/NUANCE  D:03/14/2018 T:03/15/2018 JOB:[id number]

## 2018-03-17 LAB — AEROBIC CULTURE W GRAM STAIN (SUPERFICIAL SPECIMEN)

## 2018-03-17 LAB — AEROBIC CULTURE  (SUPERFICIAL SPECIMEN): SPECIAL REQUESTS: NORMAL

## 2019-05-11 IMAGING — DX DG FINGER THUMB 2+V*L*
3 series · 3 of 3 positions shown · non-contrast
Comparison: None.

CLINICAL DATA: Injury with Danaei 6 days ago. Pain in the
interphalangeal joint.

EXAM:
LEFT THUMB 2+V

[finger ap]
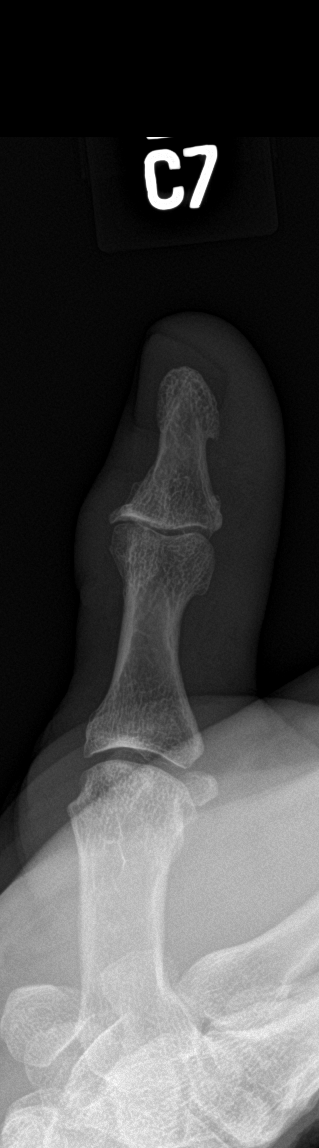

[finger obl]
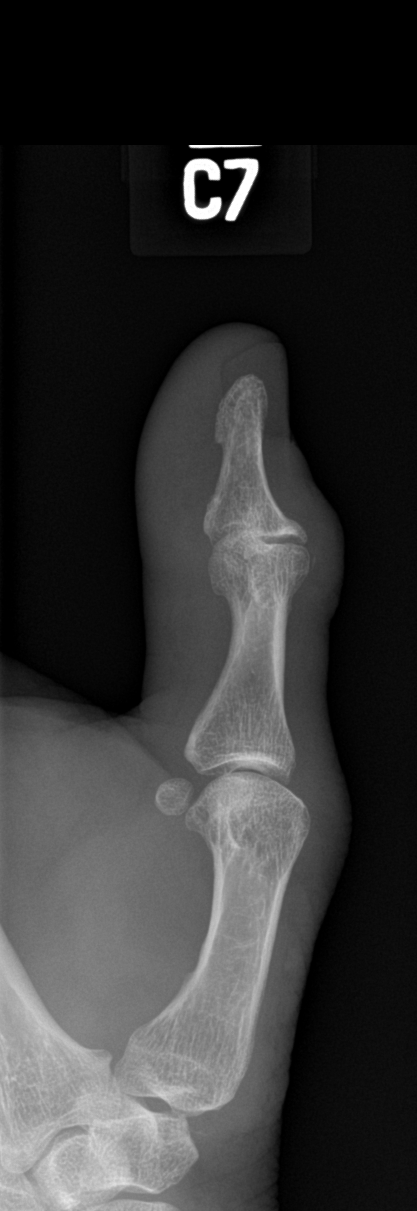

[finger lat]
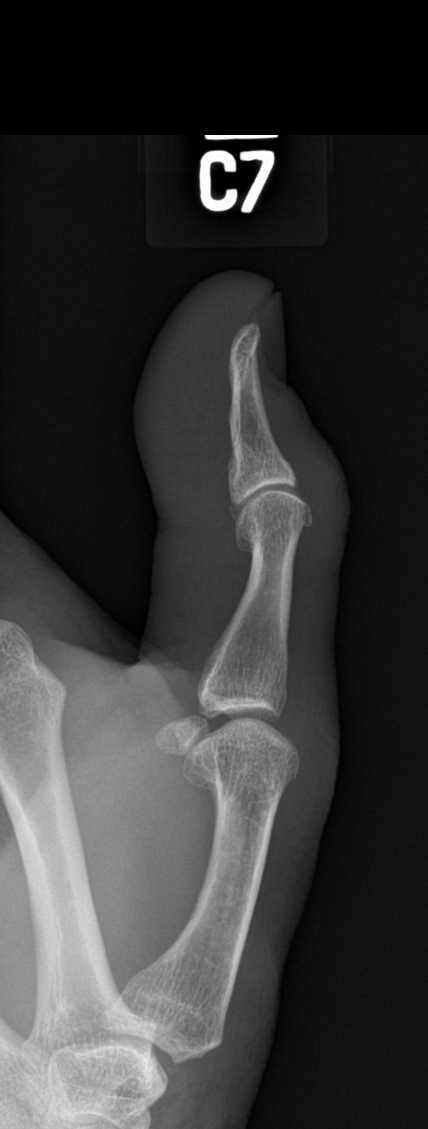

[3 of 3 positions shown; findings below may reference images not displayed]

FINDINGS: Soft tissue swelling. There is a sliver of high attenuation in the
swollen soft tissues along the dorsal aspect of the thumb. No donor
site. No fracture noted. No soft tissue gas or bony erosion. Two
tiny foci of high attenuation more superficially in the swollen soft
tissues.
IMPRESSION: 1. Three foci of high attenuation in the swollen soft tissues
overlying the interphalangeal joint. The findings are suspicious for
small foreign bodies given history. No donor sites to suggest
fracture fragments.
2. No fractures or bony erosion.
3. Degenerative changes in the interphalangeal joint.

## 2019-12-23 ENCOUNTER — Emergency Department
Admission: EM | Admit: 2019-12-23 | Discharge: 2019-12-24 | Disposition: A | Discharge status: Home / Self Care | Payer: Self-pay | Attending: Emergency Medicine | Admitting: Emergency Medicine

## 2019-12-23 ENCOUNTER — Other Ambulatory Visit: Payer: Self-pay

## 2019-12-23 DIAGNOSIS — K047 Periapical abscess without sinus: Secondary | ICD-10-CM | POA: Insufficient documentation

## 2019-12-23 DIAGNOSIS — S025XXA Fracture of tooth (traumatic), initial encounter for closed fracture: Secondary | ICD-10-CM | POA: Insufficient documentation

## 2019-12-23 DIAGNOSIS — F17200 Nicotine dependence, unspecified, uncomplicated: Secondary | ICD-10-CM | POA: Insufficient documentation

## 2019-12-23 DIAGNOSIS — K029 Dental caries, unspecified: Secondary | ICD-10-CM | POA: Insufficient documentation

## 2019-12-23 DIAGNOSIS — X58XXXA Exposure to other specified factors, initial encounter: Secondary | ICD-10-CM | POA: Insufficient documentation

## 2019-12-23 MED ORDER — IBUPROFEN 600 MG PO TABS
600.0000 mg | ORAL_TABLET | Freq: Once | ORAL | Status: AC
  Administered 2019-12-24: 600 mg via ORAL
  Filled 2019-12-23: qty 1

## 2019-12-23 MED ORDER — OXYCODONE-ACETAMINOPHEN 5-325 MG PO TABS: 1.0000 | ORAL_TABLET | ORAL | 0 refills | Status: AC | PRN

## 2019-12-23 MED ORDER — AMOXICILLIN 500 MG PO CAPS: 500.0000 mg | ORAL_CAPSULE | Freq: Three times a day (TID) | ORAL | 0 refills | Status: AC

## 2019-12-23 MED ORDER — AMOXICILLIN 500 MG PO CAPS
500.0000 mg | ORAL_CAPSULE | Freq: Once | ORAL | Status: AC
  Administered 2019-12-24: 500 mg via ORAL
  Filled 2019-12-23: qty 1

## 2019-12-23 MED ORDER — LIDOCAINE VISCOUS HCL 2 % MT SOLN
15.0000 mL | Freq: Once | OROMUCOSAL | Status: AC
  Administered 2019-12-24: 15 mL via OROMUCOSAL
  Filled 2019-12-23: qty 15

## 2019-12-23 MED ORDER — ONDANSETRON 4 MG PO TBDP
4.0000 mg | ORAL_TABLET | Freq: Once | ORAL | Status: AC
  Administered 2019-12-24: 4 mg via ORAL
  Filled 2019-12-23: qty 1

## 2019-12-23 MED ORDER — OXYCODONE-ACETAMINOPHEN 5-325 MG PO TABS
1.0000 | ORAL_TABLET | Freq: Once | ORAL | Status: AC
  Administered 2019-12-24: 1 via ORAL
  Filled 2019-12-23: qty 1

## 2019-12-23 NOTE — ED Provider Notes (Signed)
[facility] Emergency Department Provider Note   ____________________________________________   First MD Initiated Contact with Patient 12/23/19 2339     (approximate)  I have reviewed the triage vital signs and the nursing notes.   HISTORY  Chief Complaint Dental Pain (Abcess and jaw pain)    HPI Jose Novak is a 54 y.o. male who presents to the ED with a chief complaint of dental pain.  Patient reports multiple broken teeth and dental caries for years.  States 2 teeth broke off within the past week causing pain to his left upper and lower jaw.  Denies fever, swelling, difficulty swallowing.     Past medical history None  There are no problems to display for this patient.   Past Surgical History:  Procedure Laterality Date  . HERNIA REPAIR    . I & D EXTREMITY Left 03/14/2018   Procedure: IRRIGATION AND DEBRIDEMENT EXTREMITY;  Surgeon: Knute Neu, MD;  Location: [facility] OR;  Service: Plastics;  Laterality: Left;    Prior to Admission medications   Medication Sig Start Date End Date Taking? Authorizing Provider  amoxicillin (AMOXIL) 500 MG capsule Take 1 capsule (500 mg total) by mouth 3 (three) times daily. 12/23/19   Irean Hong, MD  cephALEXin (KEFLEX) 500 MG capsule Take 1 capsule (500 mg total) by mouth 4 (four) times daily. 03/14/18   Knute Neu, MD  HYDROcodone-acetaminophen (NORCO) 5-325 MG tablet Take 1-2 tablets by mouth every 6 (six) hours as needed for moderate pain. 03/14/18   Knute Neu, MD  ibuprofen (ADVIL,MOTRIN) 200 MG tablet Take 800 mg by mouth 3 (three) times daily.    [provider]  oxyCODONE-acetaminophen (PERCOCET/ROXICET) 5-325 MG tablet Take 1 tablet by mouth every 4 (four) hours as needed for severe pain. 12/23/19   Irean Hong, MD    Allergies Patient has no known allergies.  No family history on file.  Social History Social History   Tobacco Use  . Smoking status: Current Every Day Smoker    . Smokeless tobacco: Never Used  Substance Use Topics  . Alcohol use: No  . Drug use: No    Review of Systems  Constitutional: No fever/chills Eyes: No visual changes. ENT: Positive for dentalgia.  No sore throat. Cardiovascular: Denies chest pain. Respiratory: Denies shortness of breath. Gastrointestinal: No abdominal pain.  No nausea, no vomiting.  No diarrhea.  No constipation. Genitourinary: Negative for dysuria. Musculoskeletal: Negative for back pain. Skin: Negative for rash. Neurological: Negative for headaches, focal weakness or numbness.   ____________________________________________   PHYSICAL EXAM:  VITAL SIGNS: ED Triage Vitals  Enc Vitals Group     BP 12/23/19 2236 140/78     Pulse Rate 12/23/19 2236 64     Resp 12/23/19 2236 16     Temp 12/23/19 2236 97.7 F (36.5 C)     Temp Source 12/23/19 2236 Oral     SpO2 12/23/19 2236 94 %     Weight 12/23/19 2237 155 lb (70.3 kg)     Height 12/23/19 2237 5\' 9"  (1.753 m)     Head Circumference --      Peak Flow --      Pain Score 12/23/19 2236 7     Pain Loc --      Pain Edu? --      Excl. in GC? --     Constitutional: Alert and oriented.  Disheveled appearing and in mild acute distress. Eyes: Conjunctivae are normal. PERRL. EOMI.  Head: Atraumatic. Nose: No congestion/rhinnorhea. Mouth/Throat: Mucous membranes are moist.  Widespread dental caries.  Multiple missing and broken teeth.  Single molar to left upper jaw with caries.  Missing left lower molars.  No intraoral or extraoral swelling.   Neck: No stridor.   Cardiovascular: Normal rate, regular rhythm. Grossly normal heart sounds.  Good peripheral circulation. Respiratory: Normal respiratory effort.  No retractions. Lungs CTAB. Gastrointestinal: Soft and nontender. No distention. No abdominal bruits. No CVA tenderness. Musculoskeletal: No lower extremity tenderness nor edema.  No joint effusions. Neurologic:  Normal speech and language. No gross focal  neurologic deficits are appreciated. No gait instability. Skin:  Skin is warm, dry and intact. No rash noted. Psychiatric: Mood and affect are normal. Speech and behavior are normal.  ____________________________________________   LABS (all labs ordered are listed, but only abnormal results are displayed)  Labs Reviewed - No data to display ____________________________________________  EKG  None ____________________________________________  RADIOLOGY I, Gilda Abboud J, personally viewed and evaluated these images (plain radiographs) as part of my medical decision making, as well as reviewing the written report by the radiologist.  ED MD interpretation: None  Official radiology report(s): No results found.  ____________________________________________   PROCEDURES  Procedure(s) performed (including Critical Care):  Procedures   ____________________________________________   INITIAL IMPRESSION / ASSESSMENT AND PLAN / ED COURSE  As part of my medical decision making, I reviewed the following data within the electronic MEDICAL RECORD NUMBER Nursing notes reviewed and incorporated, Old chart reviewed (minor care issues from several years ago), Notes from prior ED visits (minor care issues, dental abscess) and [place] Controlled Substance Database     54 year old male presenting with dentalgia, widespread dental decay and broken teeth.  Will start Amoxicillin, lidocaine rinse, Motrin/Percocet.  List of dental clinics provided.  Strict return precautions given.  Patient verbalizes understanding agrees with plan of care.      ____________________________________________   FINAL CLINICAL IMPRESSION(S) / ED DIAGNOSES  Final diagnoses:  Dental caries  Dental infection  Closed fracture of tooth, initial encounter     ED Discharge Orders         Ordered    amoxicillin (AMOXIL) 500 MG capsule  3 times daily        12/23/19 2347    oxyCODONE-acetaminophen (PERCOCET/ROXICET) 5-325  MG tablet  Every 4 hours PRN        12/23/19 2347          *Please note:  ROSARIO KUSHNER was evaluated in Emergency Department on 12/23/2019 for the symptoms described in the history of present illness. He was evaluated in the context of the global COVID-19 pandemic, which necessitated consideration that the patient might be at risk for infection with the SARS-CoV-2 virus that causes COVID-19. Institutional protocols and algorithms that pertain to the evaluation of patients at risk for COVID-19 are in a state of rapid change based on information released by regulatory bodies including the CDC and federal and state organizations. These policies and algorithms were followed during the patient's care in the ED.  Some ED evaluations and interventions may be delayed as a result of limited staffing during and the pandemic.*   Note:  This document was prepared using Dragon voice recognition software and may include unintentional dictation errors.   Irean Hong, MD 12/24/19 [phone]

## 2019-12-23 NOTE — Discharge Instructions (Signed)
1. Take antibiotic as prescribed (Amoxicillin 500mg  three times daily x 7 days). 2. Take Ibuprofen as needed for pain, Percocet as needed for more severe pain.   3. Return to the ER for worsening symptoms, persistent vomiting, fever, difficulty breathing or other concerns.

## 2019-12-23 NOTE — ED Triage Notes (Signed)
Pt c/o of dental abscess that has moved to his jaw bone. Pt states that he had a tooth break on his upper jaw and lower jaw. Pt reports that pain in similar to when he had abscess several years ago. Pt states constant pressure in cheekbone and jaw.

## 2019-12-24 MED ORDER — ONDANSETRON 4 MG PO TBDP: 4.0000 mg | ORAL_TABLET | Freq: Three times a day (TID) | ORAL | 0 refills | Status: AC | PRN
